# Patient Record
Sex: Male | Born: 1990 | Race: Black or African American | Hispanic: No
Health system: Southern US, Community
[De-identification: ages and names within clinical notes are randomized; demographics above are authoritative.]

---

## 2018-01-17 ENCOUNTER — Other Ambulatory Visit: Payer: Self-pay

## 2018-01-17 ENCOUNTER — Emergency Department
Admission: EM | Admit: 2018-01-17 | Discharge: 2018-01-18 | Disposition: A | Discharge status: Home / Self Care | Payer: Self-pay | Attending: Emergency Medicine | Admitting: Emergency Medicine

## 2018-01-17 DIAGNOSIS — F329 Major depressive disorder, single episode, unspecified: Secondary | ICD-10-CM | POA: Insufficient documentation

## 2018-01-17 DIAGNOSIS — R45851 Suicidal ideations: Secondary | ICD-10-CM | POA: Insufficient documentation

## 2018-01-17 DIAGNOSIS — S0101XA Laceration without foreign body of scalp, initial encounter: Secondary | ICD-10-CM | POA: Insufficient documentation

## 2018-01-17 DIAGNOSIS — Y92002 Bathroom of unspecified non-institutional (private) residence single-family (private) house as the place of occurrence of the external cause: Secondary | ICD-10-CM | POA: Insufficient documentation

## 2018-01-17 DIAGNOSIS — Y999 Unspecified external cause status: Secondary | ICD-10-CM | POA: Insufficient documentation

## 2018-01-17 DIAGNOSIS — X838XXA Intentional self-harm by other specified means, initial encounter: Secondary | ICD-10-CM | POA: Insufficient documentation

## 2018-01-17 DIAGNOSIS — F32A Depression, unspecified: Secondary | ICD-10-CM

## 2018-01-17 DIAGNOSIS — F4320 Adjustment disorder, unspecified: Secondary | ICD-10-CM | POA: Insufficient documentation

## 2018-01-17 DIAGNOSIS — Z046 Encounter for general psychiatric examination, requested by authority: Secondary | ICD-10-CM | POA: Insufficient documentation

## 2018-01-17 DIAGNOSIS — Y9389 Activity, other specified: Secondary | ICD-10-CM | POA: Insufficient documentation

## 2018-01-17 LAB — COMPREHENSIVE METABOLIC PANEL
ALT: 34 U/L (ref 17–63)
ANION GAP: 12 (ref 5–15)
AST: 28 U/L (ref 15–41)
Albumin: 4.9 g/dL (ref 3.5–5.0)
Alkaline Phosphatase: 55 U/L (ref 38–126)
BUN: 18 mg/dL (ref 6–20)
CALCIUM: 9.6 mg/dL (ref 8.9–10.3)
CHLORIDE: 104 mmol/L (ref 101–111)
CO2: 21 mmol/L — ABNORMAL LOW (ref 22–32)
Creatinine, Ser: 0.88 mg/dL (ref 0.61–1.24)
GFR calc non Af Amer: >60 mL/min (ref >60)
Glucose, Bld: 96 mg/dL (ref 65–99)
Potassium: 3.5 mmol/L (ref 3.5–5.1)
Sodium: 137 mmol/L (ref 135–145)
Total Bilirubin: 0.9 mg/dL (ref 0.3–1.2)
Total Protein: 8.5 g/dL — ABNORMAL HIGH (ref 6.5–8.1)

## 2018-01-17 LAB — CBC
HCT: 45.4 % (ref 40.0–52.0)
Hemoglobin: 15.4 g/dL (ref 13.0–18.0)
MCH: 32.4 pg (ref 26.0–34.0)
MCHC: 34 g/dL (ref 32.0–36.0)
MCV: 95.3 fL (ref 80.0–100.0)
PLATELETS: 187 10*3/uL (ref 150–440)
RBC: 4.77 MIL/uL (ref 4.40–5.90)
RDW: 13 % (ref 11.5–14.5)
WBC: 5.3 10*3/uL (ref 3.8–10.6)

## 2018-01-17 LAB — ETHANOL: Alcohol, Ethyl (B): <10 mg/dL (ref <10)

## 2018-01-17 NOTE — ED Provider Notes (Signed)
[facility] Emergency Department Provider Note   ____________________________________________   First MD Initiated Contact with Patient 01/17/18 2358     (approximate)  I have reviewed the triage vital signs and the nursing notes.   HISTORY  Chief Complaint Psychiatric Evaluation    HPI James LauraDevonta Case is a 27 y.o. male brought to the ED by police under IVC for suicidal ideation.  Patient reports he was arguing with his wife after a 12-hour work shift, he was tired, became angry and purposely struck his head against a metal piece in the shower.  Denies LOC.  Tetanus is up-to-date.  Denies active SI/HI/AH/VH.  Denies headache, neck pain, vision changes, chest pain, shortness of breath, abdominal pain, nausea, vomiting or dizziness.   Past medical history None  There are no active problems to display for this patient.     Prior to Admission medications   Not on File    Allergies Patient has no allergy information on record.  No family history on file.  Social History Social History   Tobacco Use  . Smoking status: Not on file  Substance Use Topics  . Alcohol use: Not on file  . Drug use: Not on file  Smoker  Review of Systems   Constitutional: No fever/chills Eyes: No visual changes. ENT: No sore throat. Cardiovascular: Denies chest pain. Respiratory: Denies shortness of breath. Gastrointestinal: No abdominal pain.  No nausea, no vomiting.  No diarrhea.  No constipation. Genitourinary: Negative for dysuria. Musculoskeletal: Negative for back pain. Skin: Negative for rash. Neurological: Negative for headaches, focal weakness or numbness. Psychiatric:Positive for depression with suicidal thoughts.  ____________________________________________   PHYSICAL EXAM:  VITAL SIGNS: ED Triage Vitals  Enc Vitals Group     BP 01/17/18 2233 121/90     Pulse Rate 01/17/18 2233 76     Resp 01/17/18 2233 20     Temp 01/17/18 2233 99.1  F (37.3 C)     Temp Source 01/17/18 2233 Oral     SpO2 01/17/18 2233 98 %     Weight 01/17/18 2232 230 lb (104.3 kg)     Height 01/17/18 2232 6\' 3"  (1.905 m)     Head Circumference --      Peak Flow --      Pain Score 01/17/18 2231 10     Pain Loc --      Pain Edu? --      Excl. in GC? --     Constitutional: Alert and oriented. Well appearing and in no acute distress. Eyes: Conjunctivae are normal. PERRL. EOMI. Head: 0.5 cm horizontally linear well approximated frontal scalp laceration which is not bleeding and has already started to scab. Nose: No congestion/rhinnorhea. Mouth/Throat: Mucous membranes are moist.  Oropharynx non-erythematous. Neck: No stridor.  No cervical spine tenderness to palpation. Cardiovascular: Normal rate, regular rhythm. Grossly normal heart sounds.  Good peripheral circulation. Respiratory: Normal respiratory effort.  No retractions. Lungs CTAB. Gastrointestinal: Soft and nontender. No distention. No abdominal bruits. No CVA tenderness. Musculoskeletal: No lower extremity tenderness nor edema.  No joint effusions. Neurologic:  Normal speech and language. No gross focal neurologic deficits are appreciated. No gait instability. Skin:  Skin is warm, dry and intact. No rash noted. Psychiatric: Mood and affect are flat. Speech and behavior are normal.  ____________________________________________   LABS (all labs ordered are listed, but only abnormal results are displayed)  Labs Reviewed  COMPREHENSIVE METABOLIC PANEL - Abnormal; Notable for the following components:  Result Value   CO2 21 (*)    Total Protein 8.5 (*)    All other components within normal limits  URINALYSIS, COMPLETE (UACMP) WITH MICROSCOPIC - Abnormal; Notable for the following components:   Color, Urine AMBER (*)    APPearance HAZY (*)    Ketones, ur 5 (*)    Protein, ur 30 (*)    Squamous Epithelial / LPF 0-5 (*)    All other components within normal limits  ACETAMINOPHEN  LEVEL - Abnormal; Notable for the following components:   Acetaminophen (Tylenol), Serum <10 (*)    All other components within normal limits  CBC  ETHANOL  URINE DRUG SCREEN, QUALITATIVE ([facility] ONLY)  SALICYLATE LEVEL   ____________________________________________  EKG  None ____________________________________________  RADIOLOGY  ED MD interpretation: No ICH  Official radiology report(s): Ct Head Wo Contrast  Result Date: 01/18/2018 CLINICAL DATA:  27 y/o  M; head injury. EXAM: CT HEAD WITHOUT CONTRAST TECHNIQUE: Contiguous axial images were obtained from the base of the skull through the vertex without intravenous contrast. COMPARISON:  None. FINDINGS: Brain: No evidence of acute infarction, hemorrhage, hydrocephalus, extra-axial collection or mass lesion/mass effect. Vascular: No hyperdense vessel or unexpected calcification. Skull: Normal. Negative for fracture or focal lesion. Sinuses/Orbits: No acute finding. Other: None. IMPRESSION: Normal CT of the head. Electronically Signed   By: Mitzi Hansen M.D.   On: 01/18/2018 04:17    ____________________________________________   PROCEDURES  Procedure(s) performed: None  Procedures  Critical Care performed: No  ____________________________________________   INITIAL IMPRESSION / ASSESSMENT AND PLAN / ED COURSE  As part of my medical decision making, I reviewed the following data within the electronic MEDICAL RECORD NUMBER Nursing notes reviewed and incorporated, Labs reviewed, Old chart reviewed, A consult was requested and obtained from this/these consultant(s) Psychiatry and Notes from prior ED visits   27 year old male who presents to the ED under IVC for suicidal ideation.  He did purposely strike his forehead in a fit of anger and has a well approximated tiny linear scalp laceration.  Patient declines staple or suture repair.  Denies LOC.  Incident occurred approximately 9 PM.  Currently he is neurologically  intact without focal deficits.  CT head not warranted at this time.  Since patient will be in the ED for an extended period of time pending TTS and [facility] psychiatry evaluations, will monitor patient's neurological status.  Lab work unremarkable.  Awaiting urinalysis.  Will maintain patient under IVC pending psychiatric disposition.  Clinical Course as of Jan 19 604  Thu Jan 18, 2018  0320 Patient was drowsy for TTS interview.  Most likely this is secondary to patient's long work day and now it is the middle of the night; however, given that patient did strike his forehead on a metal object in the shower, will obtain CT head to evaluate for intracranial injury.   [JS]  0605 CT head unremarkable.  Patient resting no acute distress.  Disposition pending psychiatry.   [JS]    Clinical Course User Index [JS] Irean Hong, MD     ____________________________________________   FINAL CLINICAL IMPRESSION(S) / ED DIAGNOSES  Final diagnoses:  Depression, unspecified depression type  Scalp laceration, initial encounter     ED Discharge Orders    None       Note:  This document was prepared using Dragon voice recognition software and may include unintentional dictation errors.    Irean Hong, MD 01/18/18 [phone]

## 2018-01-17 NOTE — ED Triage Notes (Signed)
Pt brought in by [place] police under IVC by wife. Police states wife called for pt threatening to hurt himself and others. Pt states he was arguing with wife, and he became angry and hit head against metal piece in shower. States was angry and denies any SI or HI.

## 2018-01-18 ENCOUNTER — Emergency Department: Payer: Self-pay

## 2018-01-18 LAB — URINALYSIS, COMPLETE (UACMP) WITH MICROSCOPIC
BACTERIA UA: NONE SEEN
Bilirubin Urine: NEGATIVE
Glucose, UA: NEGATIVE mg/dL
Hgb urine dipstick: NEGATIVE
KETONES UR: 5 mg/dL — AB
Leukocytes, UA: NEGATIVE
Nitrite: NEGATIVE
PH: 5 (ref 5.0–8.0)
Protein, ur: 30 mg/dL — AB
SPECIFIC GRAVITY, URINE: 1.029 (ref 1.005–1.030)

## 2018-01-18 LAB — SALICYLATE LEVEL

## 2018-01-18 LAB — URINE DRUG SCREEN, QUALITATIVE (ARMC ONLY)
Amphetamines, Ur Screen: NOT DETECTED
BARBITURATES, UR SCREEN: NOT DETECTED
BENZODIAZEPINE, UR SCRN: NOT DETECTED
Cannabinoid 50 Ng, Ur ~~LOC~~: NOT DETECTED
Cocaine Metabolite,Ur ~~LOC~~: NOT DETECTED
MDMA (Ecstasy)Ur Screen: NOT DETECTED
METHADONE SCREEN, URINE: NOT DETECTED
OPIATE, UR SCREEN: NOT DETECTED
Phencyclidine (PCP) Ur S: NOT DETECTED
TRICYCLIC, UR SCREEN: NOT DETECTED

## 2018-01-18 LAB — ACETAMINOPHEN LEVEL: Acetaminophen (Tylenol), Serum: <10 ug/mL — ABNORMAL LOW (ref 10–30)

## 2018-01-18 MED ORDER — PRAZOSIN HCL 2 MG PO CAPS: 2.0000 mg | ORAL_CAPSULE | Freq: Every day | ORAL | 0 refills | Status: AC

## 2018-01-18 MED ORDER — ACETAMINOPHEN 325 MG PO TABS
650.0000 mg | ORAL_TABLET | Freq: Once | ORAL | Status: AC
  Administered 2018-01-18: 650 mg via ORAL
  Filled 2018-01-18: qty 2

## 2018-01-18 NOTE — ED Provider Notes (Signed)
Cleared for discharge by psychiatry SOC, [facility]   Ayn Domangue, MD 01/18/18 [phone]

## 2018-01-18 NOTE — ED Notes (Signed)
Pt. Oriented to Unit.  Pt. Advised of cameras and 15 safety checks.

## 2018-01-18 NOTE — BH Assessment (Signed)
Assessment Note  James Case is a 27 y.o. male who was brought to the [facility] ED via EMS due to his wife calling them and filing an IVC after (she claims) pt hit his head against the wall/shower head in their shower and threatened to kill himself. Pt acknowledges that he hit his head but denies that he ever said he was going to harm himself. Pt was highly irritated throughout the assessment and continuously fell asleep after every few questions.  Pt denied SI, HI, AVH, and NSSIB. Pt acknowledges he has been diagnosed and treated for PTSD and shares he was prescribed medication but states he never followed through with taking the medication. Pt shares he participated in outpatient therapy for 1-2 years for the PTSD.  Pt denies SA, access to weapons, pending criminal charges, upcoming court hearings, or being on probation. Pt denies being the victim of any past or present abuse. Pt shares his appetite is fine and states he gets about 4 hours of sleep per night. He is able to complete his ADLs independently and lists his wife as his greatest support.  Pt is oriented x4. His recent and remote memory is intact. Pt was irritable throughout the assessment and he had difficulties answering the questions due to continuously falling asleep. Pt's insight and judgement are fair and his impulse control is impaired.   Diagnosis: F43.10, Posttraumatic stress disorder   Past Medical History: No past medical history on file.   Family History: No family history on file.  Social History:  has no tobacco, alcohol, and drug history on file.  Additional Social History:  Alcohol / Drug Use Pain Medications: Please see MAR Prescriptions: Please see MAR Over the Counter: Please see MAR History of alcohol / drug use?: No history of alcohol / drug abuse Longest period of sobriety (when/how long): N/A  CIWA: CIWA-Ar BP: 121/90 Pulse Rate: 76 COWS:    Allergies: Not on File  Home Medications:  (Not in a  hospital admission)  OB/GYN Status:  No LMP for male patient.  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: Utter Is patient pregnant?: No Pregnancy Status: No Living Arrangements: Spouse/significant other, Children Can pt return to current living arrangement?: Yes Admission Status: Involuntary Is patient capable of signing voluntary admission?: No Referral Source: MD Insurance type: None  Medical Screening Exam [facility] Walk-in ONLY) Medical Exam completed: Yes  Crisis Care Plan Living Arrangements: Spouse/significant other, Children Legal Guardian: Other:(N/A) Name of Psychiatrist: N/A Name of Therapist: N/A  Education Status Is patient currently in school?: No Is the patient employed, unemployed or receiving disability?: Employed  Risk to self with the past 6 months Suicidal Ideation: No Has patient been a risk to self within the past 6 months prior to admission? : No Suicidal Intent: No Has patient had any suicidal intent within the past 6 months prior to admission? : No Is patient at risk for suicide?: No Suicidal Plan?: No Has patient had any suicidal plan within the past 6 months prior to admission? : No Access to Means: No What has been your use of drugs/alcohol within the last 12 months?: Pt denies Previous Attempts/Gestures: No How many times?: 0 Other Self Harm Risks: None noted Triggers for Past Attempts: None known Intentional Self Injurious Behavior: None Family Suicide History: No Recent stressful life  event(s): Conflict (Comment)(Pt & his wife have been arguing; pt has been working much) Persecutory voices/beliefs?: No Depression: No Depression Symptoms: (Pt denies) Substance abuse history and/or treatment for substance abuse?:  No Suicide prevention information given to non-admitted patients: Not applicable  Risk to Others within the past 6 months Homicidal Ideation: No Does patient have any lifetime risk of violence toward others beyond the six months prior to admission? : No Thoughts of Harm to Others: No Current Homicidal Intent: No Current Homicidal Plan: No Access to Homicidal Means: No Identified Victim: N/A History of harm to others?: No Assessment of Violence: On admission Violent Behavior Description: None noted Does patient have access to weapons?: No Criminal Charges Pending?: No Does patient have a court date: No Is patient on probation?: No  Psychosis Hallucinations: None noted Delusions: None noted  Mental Status Report Appearance/Hygiene: In scrubs Eye Contact: Poor(Pt continuously falls asleep) Motor Activity: Unable to assess(Pt is lying in bed) Speech: Slow, Argumentative, Other (Comment)(Pt is highly irritated) Level of Consciousness: Sleeping, Other (Comment)(Pt continuously falls asleep during assessment) Mood: Irritable Affect: Irritable Anxiety Level: None Thought Processes: Circumstantial Judgement: Partial Orientation: Person, Place, Time, Situation Obsessive Compulsive Thoughts/Behaviors: None  Cognitive Functioning Concentration: Poor Memory: Recent Intact, Remote Intact Is patient IDD: No Is patient DD?: No Insight: Fair Impulse Control: Poor Appetite: Good Have you had any weight changes? : No Change Sleep: Decreased Total Hours of Sleep: 4 Vegetative Symptoms: None  ADLScreening [facility] Assessment Services) Patient's cognitive ability adequate to safely complete daily activities?: Yes Patient able to express need for assistance with ADLs?: Yes Independently performs ADLs?: Yes (appropriate for developmental age)  Prior Inpatient Therapy Prior Inpatient Therapy: No  Prior Outpatient Therapy Prior Outpatient Therapy: Yes Prior Therapy Dates:  2017-2018 [facility] Reason for Treatment: PTSD Does patient have an ACCT team?: No Does patient have Intensive In-House Services?  : No Does patient have [facility] services? : No Does patient have P4CC services?: No  ADL Screening (condition at time of admission) Patient's cognitive ability adequate to safely complete daily activities?: Yes Is the patient deaf or have difficulty hearing?: No Does the patient have difficulty seeing, even when wearing glasses/contacts?: No Does the patient have difficulty concentrating, remembering, or making decisions?: No Patient able to express need for assistance with ADLs?: Yes Does the patient have difficulty dressing or bathing?: No Independently performs ADLs?: Yes (appropriate for developmental age) Does the patient have difficulty walking or climbing stairs?: No       Abuse/Neglect Assessment (Assessment to be complete while patient is alone) Abuse/Neglect Assessment Can Be Completed: Yes Physical Abuse: Denies Verbal Abuse: Denies Sexual Abuse: Denies Exploitation of patient/patient's resources: Denies Self-Neglect: Denies Values / Beliefs Cultural Requests During Hospitalization: None Spiritual Requests During Hospitalization: None Consults Spiritual Care Consult Needed: No Social Work Consult Needed: No Merchant navy officerAdvance Directives (For Healthcare) Does Patient Have a Medical Advance Directive?: No Would patient like information on creating a medical advance directive?: No - Patient declined          Disposition:  Disposition Initial Assessment Completed for this Encounter: Yes  On Site Evaluation by:   Reviewed with Physician:    Ralph DowdySamantha L Lamoyne Palencia 01/18/2018 3:21 AM

## 2018-01-18 NOTE — ED Notes (Signed)
During TTS assessment. Patient was not able to stay away during assessment. EDP made aware. CT scan ordered.

## 2018-01-18 NOTE — ED Notes (Signed)
Patient complaining of headache 10/10. EDP made aware. Verbal order for 650 tylenol given. Patient resting with eyes closed.

## 2018-01-18 NOTE — ED Notes (Signed)
TTS in with patient.  

## 2018-12-27 IMAGING — CT CT HEAD W/O CM
3 series · 15 of 47 positions shown, 18 images · non-contrast
Comparison: None.

CLINICAL DATA: 26 y/o  M; head injury.

EXAM:
CT HEAD WITHOUT CONTRAST
TECHNIQUE: Contiguous axial images were obtained from the base of the skull
through the vertex without intravenous contrast.

[Series 3: head wo · axial · 0.45mm/px · z∈[-111,+14]mm · 9 of 31 slices shown, 12 images]
[im 3/31  brain]
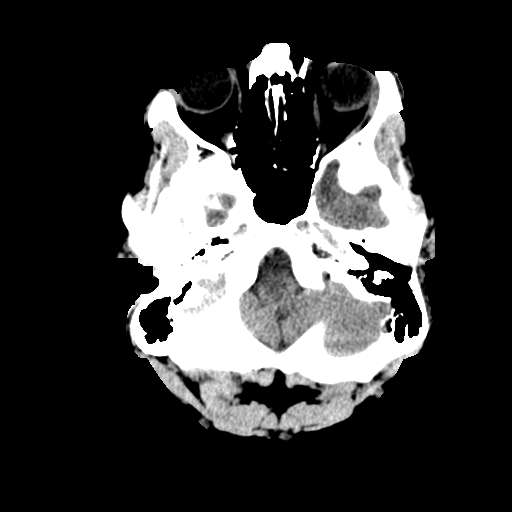
[im 3/31  bone]
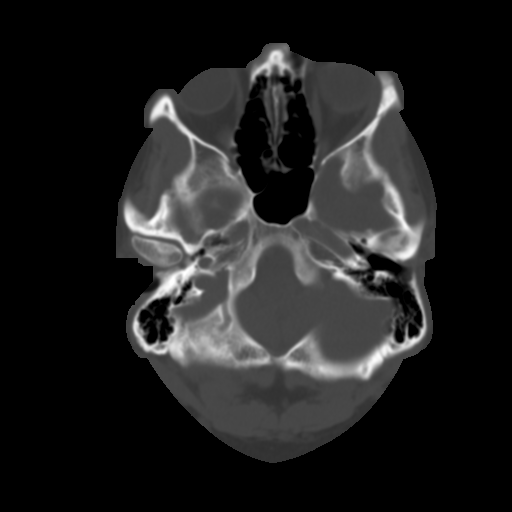
[im 6/31  brain]
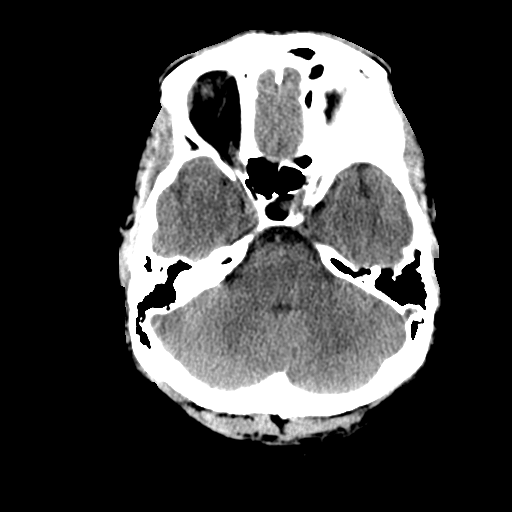
[im 9/31  brain]
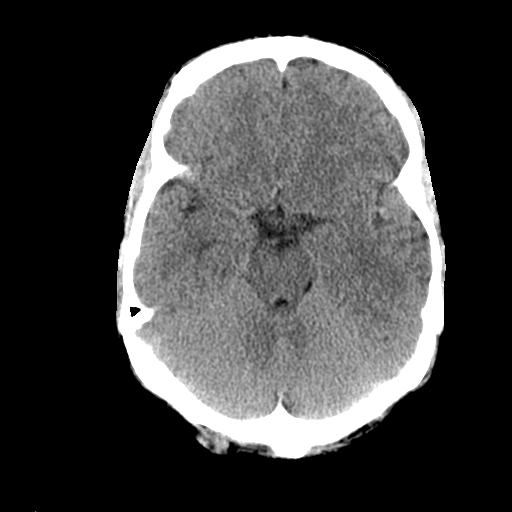
[im 12/31  brain]
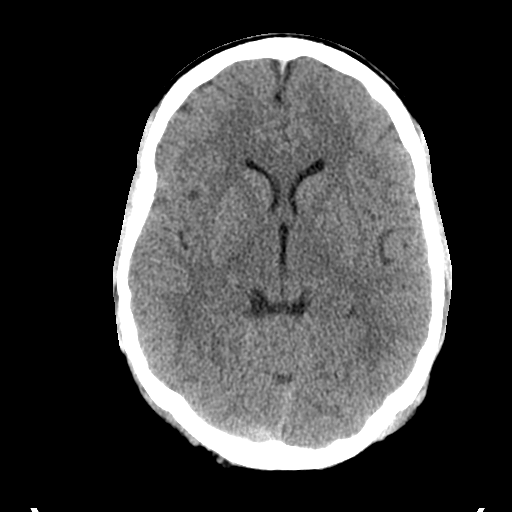
[im 16/31  brain]
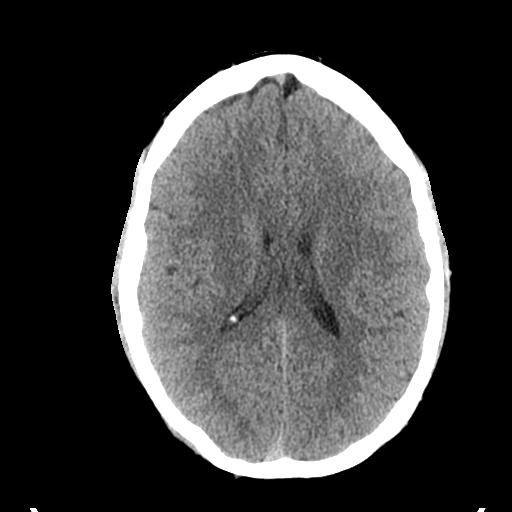
[im 16/31  bone]
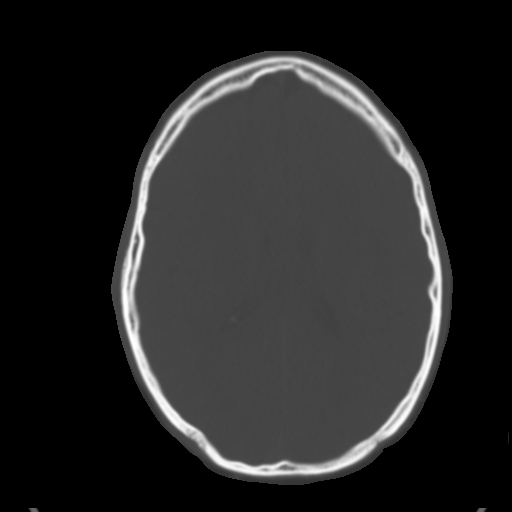
[im 19/31  brain]
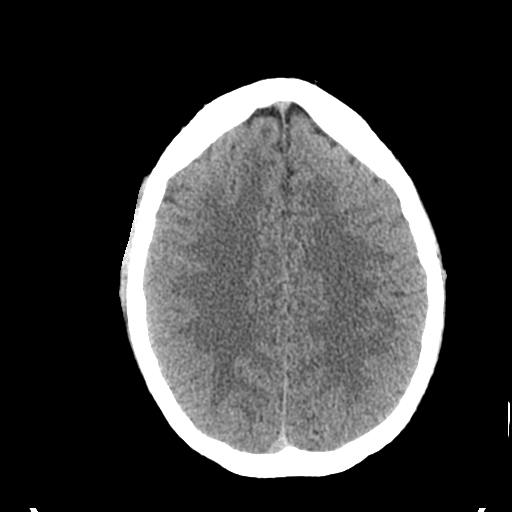
[im 22/31  brain]
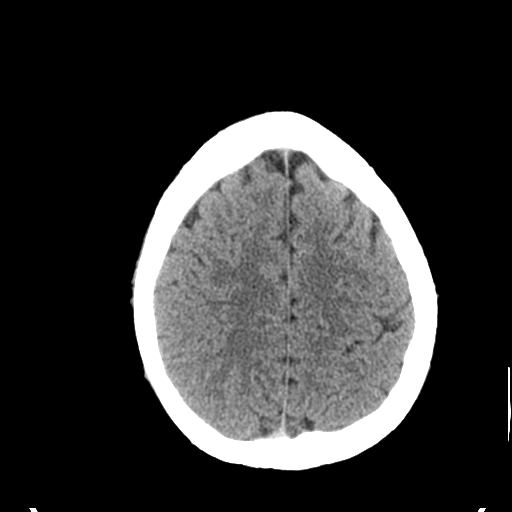
[im 25/31  brain]
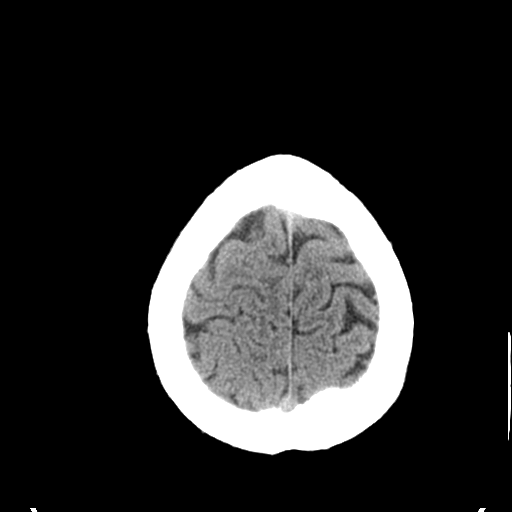
[im 28/31  brain]
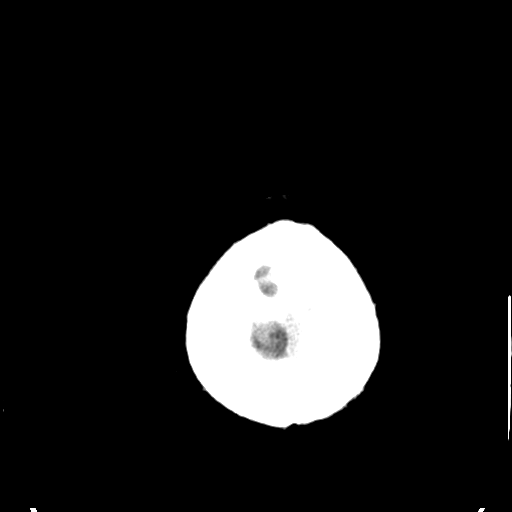
[im 28/31  bone]
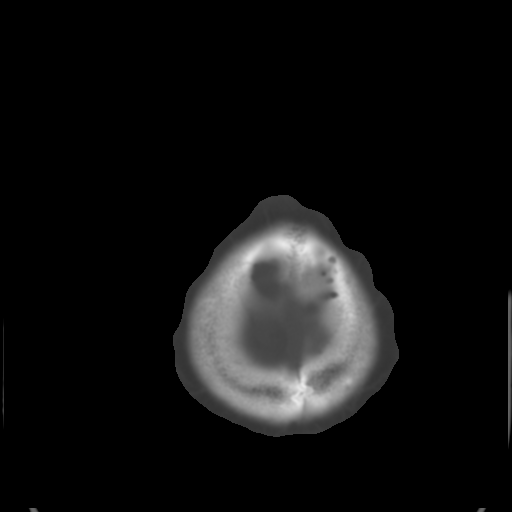

[Series 4: coronal soft tissue · coronal · 0.31mm/px · 3 of 64 slices shown]
[im 22/64  brain]
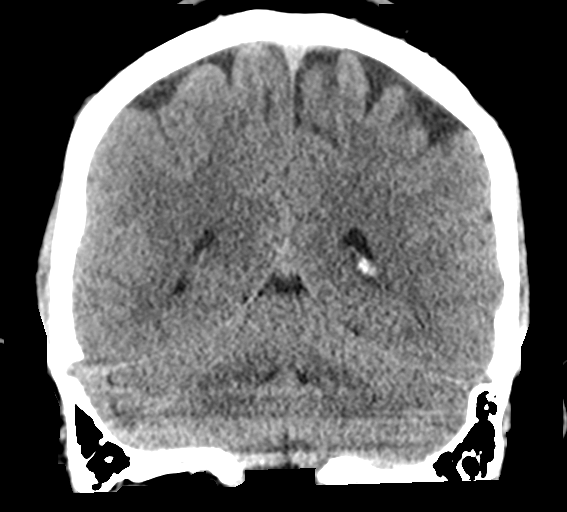
[im 29/64  brain]
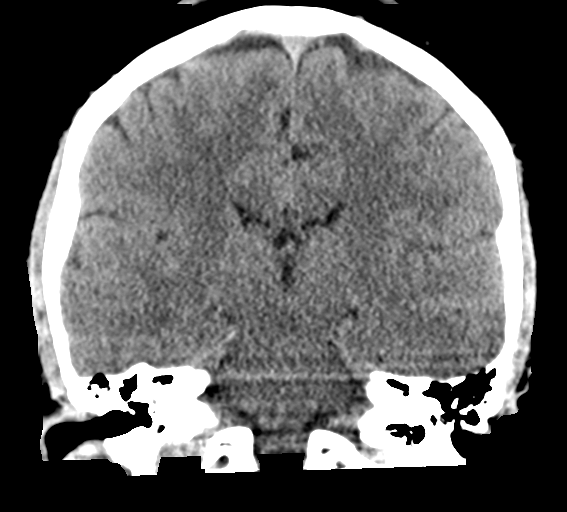
[im 36/64  brain]
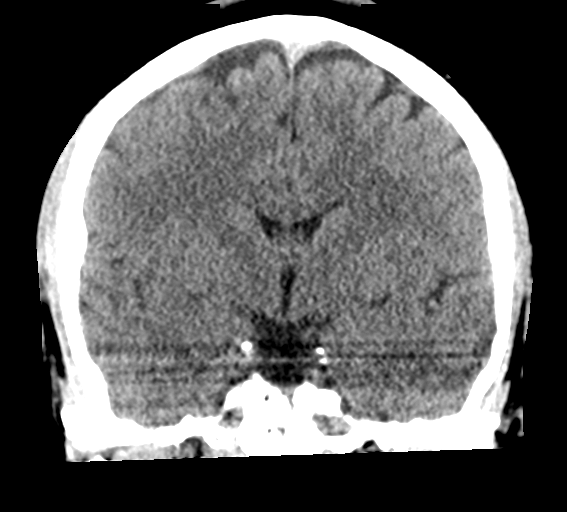

[Series 5: sagittal soft tissue · sagittal · 0.31mm/px · 3 of 56 slices shown]
[im 19/56  brain]
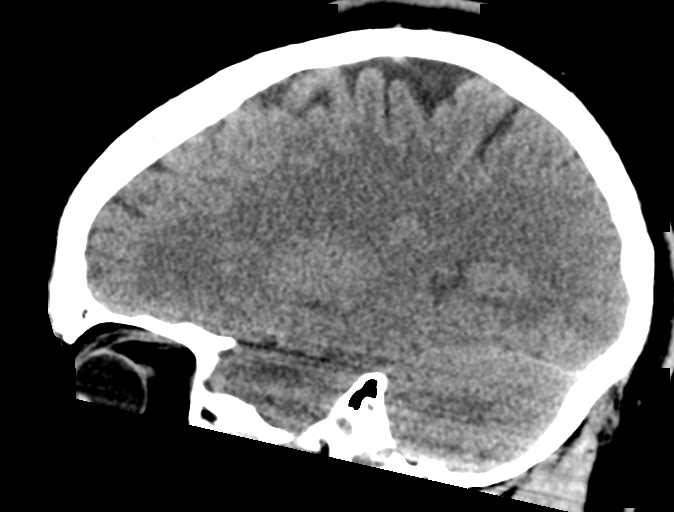
[im 28/56  brain]
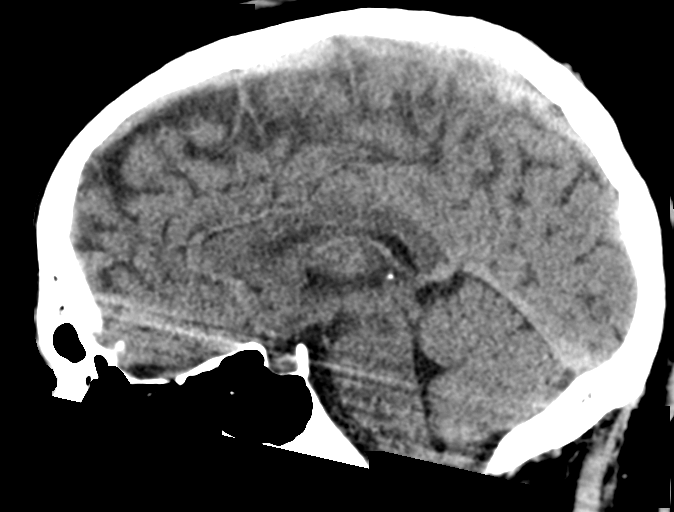
[im 37/56  brain]
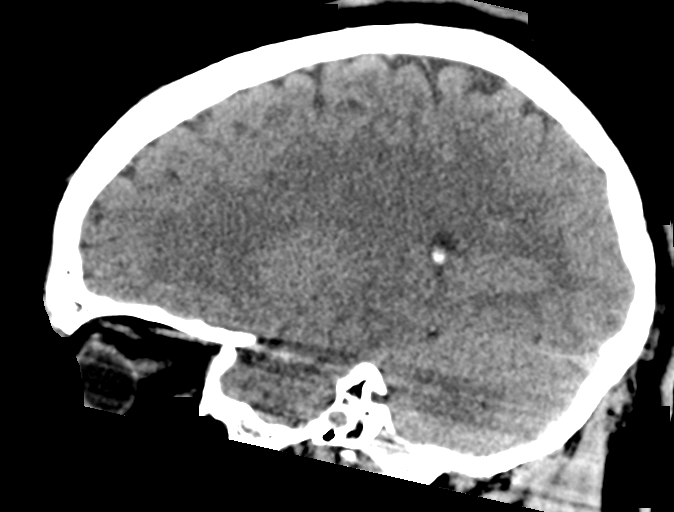

[15 of 47 positions shown; findings below may reference images not displayed]

FINDINGS: Brain: No evidence of acute infarction, hemorrhage, hydrocephalus,
extra-axial collection or mass lesion/mass effect.

Vascular: No hyperdense vessel or unexpected calcification.

Skull: Normal. Negative for fracture or focal lesion.

Sinuses/Orbits: No acute finding.

Other: None.
IMPRESSION: Normal CT of the head.

By: Bogataj Marajn M.D.
